# Patient Record
Sex: Female | Born: 2005
Health system: Southern US, Community
[De-identification: ages and names within clinical notes are randomized; demographics above are authoritative.]

## PROBLEM LIST (undated history)

## (undated) DIAGNOSIS — R51 Headache: Secondary | ICD-10-CM

## (undated) DIAGNOSIS — G360 Neuromyelitis optica [Devic]: Secondary | ICD-10-CM

## (undated) DIAGNOSIS — G379 Demyelinating disease of central nervous system, unspecified: Secondary | ICD-10-CM

## (undated) DIAGNOSIS — Z820 Family history of epilepsy and other diseases of the nervous system: Secondary | ICD-10-CM

## (undated) DIAGNOSIS — F419 Anxiety disorder, unspecified: Secondary | ICD-10-CM

## (undated) DIAGNOSIS — R011 Cardiac murmur, unspecified: Secondary | ICD-10-CM

## (undated) DIAGNOSIS — T7840XA Allergy, unspecified, initial encounter: Secondary | ICD-10-CM

## (undated) DIAGNOSIS — R519 Headache, unspecified: Secondary | ICD-10-CM

## (undated) HISTORY — DX: Headache: R51

## (undated) HISTORY — DX: Neuromyelitis optica (devic): G36.0

## (undated) HISTORY — DX: Cardiac murmur, unspecified: R01.1

## (undated) HISTORY — DX: Demyelinating disease of central nervous system, unspecified: G37.9

## (undated) HISTORY — DX: Family history of epilepsy and other diseases of the nervous system: Z82.0

## (undated) HISTORY — DX: Headache, unspecified: R51.9

## (undated) HISTORY — DX: Allergy, unspecified, initial encounter: T78.40XA

## (undated) HISTORY — DX: Anxiety disorder, unspecified: F41.9

---
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
## 2023-01-13 ENCOUNTER — Encounter: Payer: Self-pay | Admitting: Family Medicine

## 2023-01-13 ENCOUNTER — Ambulatory Visit: Payer: Federal, State, Local not specified - PPO | Admitting: Family Medicine

## 2023-01-13 VITALS — BP 104/80 | HR 98 | Temp 98.7°F | Ht 63.19 in | Wt 156.4 lb

## 2023-01-13 DIAGNOSIS — G43009 Migraine without aura, not intractable, without status migrainosus: Secondary | ICD-10-CM | POA: Diagnosis not present

## 2023-01-13 MED ORDER — SUMATRIPTAN SUCCINATE 50 MG PO TABS: 50.0000 mg | ORAL_TABLET | ORAL | 1 refills | Status: DC

## 2023-01-13 NOTE — Progress Notes (Unsigned)
New Patient Office Visit  Subjective:  Patient ID: Sharon Clark, female    DOB: [birth year]  Age: 17 y.o. MRN: [id number]  CC:  Chief Complaint  Patient presents with   Establish Care    Need new pcp     HPI Sharon Clark presents for new pt.  HA  HA since 7th grade.  Weren't as bad.  Around 9th, more frequent.  Would be qod.  Would be severe and need to sleep.  Has tried OTC and sinus meds.  Warm pack on head and sleep helps. Saw neuro in past 2 yrs.  Daily med not help and one for break thru. Getting HA every 1-2 wks. Around nose and eyes. Can be pounding and go to back of head. HA can last 6 hrs.  Some can be dull and lingering.  If starts in eve, bed helps. Can be nauseated.  Some are menstrual.  +photo, some phono.   No aura  HA center on [place] within 2 yrs. Has seen chiro-Dr. Jannifer Clark.  Told "reverse curve".  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 No pertinent surgical history.  History reviewed. No pertinent family history.  Social History   Socioeconomic History   Marital status: Single    Spouse name: Not on file   Number of children: Not on file   Years of education: Not on file   Highest education level: Not on file  Occupational History   Not on file  Tobacco Use   Smoking status: Never   Smokeless tobacco: Never  Vaping Use   Vaping Use: Never used  Substance and Sexual Activity   Alcohol use: Never   Drug use: Never   Sexual activity: Yes    Birth control/protection: Condom  Other Topics Concern   Not on file  Social History Narrative   Sharon Clark is a 9th grade student.   She attends [employer].   She lives with both parents. She has one sister.   She enjoys sleeping, going out sometimes, and school.   Social Determinants of Health    Financial Resource Strain: Not on file  Food Insecurity: Not on file  Transportation Needs: Not on file  Physical Activity: Not on file  Stress: Not on file  Social Connections: Not on file  Intimate Partner Violence: Not on file    ROS  ROS: Gen: no fever, chills  Skin: no rash, itching ENT: no ear pain, ear drainage, nasal congestion, rhinorrhea, sinus pressure, sore throat Eyes: no blurry vision, double vision Resp: no cough, wheeze,SOB CV: no CP, palpitations, LE edema,  GI: no heartburn, n/v/d/c, abd pain GU: no dysuria, urgency, frequency, hematuria.  Menses reg.  Occ skips a month.  condoms MSK: no joint pain, myalgias, back pain Neuro: no dizziness, , weakness, vertigo Psych: no depression, anxiety, insomnia, SI  Occ fatigue  Objective:   Today's Vitals: BP 104/80   Pulse 98   Temp 98.7 F (37.1 C) (Temporal)   Ht 5' 3.19" (1.605 m)   Wt 156 lb 6 oz (70.9 kg)   LMP 01/05/2023 (Exact Date)   SpO2 98%   BMI 27.54 kg/m   Physical Exam  Gen: WDWN NAD HEENT: NCAT, conjunctiva not injected,  sclera nonicteric TM WNL B, OP moist, no exudates  NECK:  supple, no thyromegaly, no nodes, no carotid bruits CARDIAC: RRR, S1S2+, no murmur. DP 2+B LUNGS: CTAB. No wheezes ABDOMEN:  BS+, soft, NTND, No HSM, no masses EXT:  no edema MSK: no gross abnormalities.  NEURO: A&O x3.  CN II-XII intact.  PSYCH: normal mood. Good eye contact   Assessment & Plan:   Problem List Items Addressed This Visit   None   Outpatient Encounter Medications as of 01/13/2023  Medication Sig   B Complex Vitamins (B COMPLEX PO) Take 1 tablet by mouth daily.   Cholecalciferol (D3 PO) Take 1 capsule by mouth daily.   Multiple Vitamin (MULTI-VITAMIN) tablet Take 1 tablet by mouth daily.   [DISCONTINUED] MIGRELIEF 200-180-50 MG TABS Take 2 tablets daily.   No facility-administered encounter medications on file as of 01/13/2023.    Follow-up: No follow-ups on file.   Wellington Hampshire, MD

## 2023-01-13 NOTE — Patient Instructions (Signed)
Welcome to [facility]! It was a pleasure meeting you today.  As discussed, Please schedule a 2-3 month follow up visit today.  Magnesium 250-'400mg'$  daily.    PLEASE NOTE:  If you had any LAB tests please let us know if you have not heard back within a few days. You may see your results on MyChart before we have a chance to review them but we will give you a call once they are reviewed by [place]. If we ordered any REFERRALS today, please let us know if you have not heard from their office within the next week.  Let us know through MyChart if you are needing REFILLS, or have your pharmacy send [place] the request. You can also use MyChart to communicate with me or any office staff.  Please try these tips to maintain a healthy lifestyle:  Eat most of your calories during the day when you are active. Eliminate processed foods including packaged sweets (pies, cakes, cookies), reduce intake of potatoes, white bread, white pasta, and white rice. Look for whole grain options, oat flour or almond flour.  Each meal should contain half fruits/vegetables, one quarter protein, and one quarter carbs (no bigger than a computer mouse).  Cut down on sweet beverages. This includes juice, soda, and sweet tea. Also watch fruit intake, though this is a healthier sweet option, it still contains natural sugar! Limit to 3 servings daily.  Drink at least 1 glass of water with each meal and aim for at least 8 glasses per day  Exercise at least 150 minutes every week.

## 2023-03-17 ENCOUNTER — Ambulatory Visit: Payer: Federal, State, Local not specified - PPO | Admitting: Family Medicine

## 2023-03-17 ENCOUNTER — Encounter: Payer: Self-pay | Admitting: Family Medicine

## 2023-03-17 VITALS — BP 110/84 | HR 100 | Temp 98.0°F | Resp 18 | Ht 63.19 in | Wt 161.1 lb

## 2023-03-17 DIAGNOSIS — G43009 Migraine without aura, not intractable, without status migrainosus: Secondary | ICD-10-CM | POA: Diagnosis not present

## 2023-03-17 MED ORDER — SUMATRIPTAN SUCCINATE 50 MG PO TABS: 50.0000 mg | ORAL_TABLET | ORAL | 3 refills | Status: DC

## 2023-03-17 NOTE — Progress Notes (Signed)
   Subjective:     Patient ID: Sharon Clark, female    DOB: 12-Oct-2006, 17 y.o.   MRN: [id number]  Chief Complaint  Patient presents with   Migraine    2 month follow-up on migraine, Imitrex helps, has had a headache everyday since last Friday since moving back home, possibly stress related     HPI  Migraine no aura.  Imitrex helps.  Occasional has to take second dose. . Was getting every 1-3/wks Moved back home last week(s) and now daily headache(s). Log-reviewed-as above.  Magnesium helps sleep better, but not sure about headache(s) prevention.  Did try some medications in past from headache(s) clinic-didn't work well and not want medications.  Average 10 headache(s)/month.    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 No pertinent surgical history.   Current Outpatient Medications:    B Complex Vitamins (B COMPLEX PO), Take 1 tablet by mouth daily., Disp: , Rfl:    Cholecalciferol (D3 PO), Take 1 capsule by mouth daily., Disp: , Rfl:    Multiple Vitamin (MULTI-VITAMIN) tablet, Take 1 tablet by mouth daily., Disp: , Rfl:    SUMAtriptan (IMITREX) 50 MG tablet, Take 1 tablet (50 mg total) by mouth as directed. Take 1 tab at onset of headache, can reapeat once in 2 hrs if needed, Disp: 30 tablet, Rfl: 3  No Known Allergies ROS neg/noncontributory except as noted HPI/below      Objective:     BP 110/84   Pulse 100   Temp 98 F (36.7 C) (Temporal)   Resp 18   Ht 5' 3.19" (1.605 m)   Wt 161 lb 2 oz (73.1 kg)   LMP 03/06/2023 (Exact Date) Comment: 5/3-03/11/23  SpO2 99%   BMI 28.37 kg/m  Wt Readings from  Last 3 Encounters:  03/17/23 161 lb 2 oz (73.1 kg) (89 %, Z= 1.22)*  01/13/23 156 lb 6 oz (70.9 kg) (87 %, Z= 1.11)*  08/11/18 130 lb 3.2 oz (59.1 kg) (78 %, Z= 0.77)*   * Growth percentiles are based on CDC (Girls, 2-20 Years) data.    Physical Exam   Gen: WDWN NAD HEENT: NCAT, conjunctiva not injected, sclera nonicteric NECK:  supple, no thyromegaly, no nodes, no carotid bruits CARDIAC: RRR, S1S2+, no murmur.  LUNGS: CTAB. No wheeze EXT:  no edema MSK: no gross abnormalities.  NEURO: A&O x3.  CN II-XII intact.  PSYCH: normal mood. Good eye contact     Assessment & Plan:  Migraine without aura and without status migrainosus, not intractable Assessment & Plan: Chronic. Controlled w/Imitrex 50 mg but taking 9+/month.  Discussed preventative medications-patient declined  she will pursue acupuncture/chiro/yoga, etc-agree.     Other orders -     SUMAtriptan Succinate; Take 1 tablet (50 mg total) by mouth as directed. Take 1 tab at onset of headache, can reapeat once in 2 hrs if needed  Dispense: 30 tablet; Refill: 3    Angelena Sole, MD

## 2023-03-17 NOTE — Patient Instructions (Signed)
Ginger    

## 2023-03-17 NOTE — Assessment & Plan Note (Signed)
Chronic. Controlled w/Imitrex 50 mg but taking 9+/month.  Discussed preventative medications-patient declined  she will pursue acupuncture/chiro/yoga, etc-agree.

## 2023-03-20 ENCOUNTER — Other Ambulatory Visit: Payer: Self-pay

## 2023-03-20 ENCOUNTER — Emergency Department (HOSPITAL_COMMUNITY)
Admission: EM | Admit: 2023-03-20 | Discharge: 2023-03-21 | Disposition: A | Discharge status: Home / Self Care | Payer: Federal, State, Local not specified - PPO | Attending: Emergency Medicine | Admitting: Emergency Medicine

## 2023-03-20 ENCOUNTER — Encounter (HOSPITAL_COMMUNITY): Payer: Self-pay

## 2023-03-20 DIAGNOSIS — R110 Nausea: Secondary | ICD-10-CM | POA: Insufficient documentation

## 2023-03-20 DIAGNOSIS — R519 Headache, unspecified: Secondary | ICD-10-CM | POA: Diagnosis not present

## 2023-03-20 DIAGNOSIS — Z0389 Encounter for observation for other suspected diseases and conditions ruled out: Secondary | ICD-10-CM | POA: Diagnosis not present

## 2023-03-20 NOTE — ED Triage Notes (Signed)
Pt. Arrives c/o a headache since 7pm. Pt. Endorses nausea, and photo sensitivity. Pt. Has a family hx of brain aneurysms at young age and is worried that she could have one as well.

## 2023-03-21 ENCOUNTER — Emergency Department (HOSPITAL_COMMUNITY): Payer: Federal, State, Local not specified - PPO

## 2023-03-21 DIAGNOSIS — Z0389 Encounter for observation for other suspected diseases and conditions ruled out: Secondary | ICD-10-CM | POA: Diagnosis not present

## 2023-03-21 LAB — BASIC METABOLIC PANEL
Anion gap: 7 (ref 5–15)
Anion gap: 7 (ref 5–15)
BUN: 9 mg/dL (ref 6–20)
BUN: 11 mg/dL (ref 6–20)
CO2: 14 mmol/L — ABNORMAL LOW (ref 22–32)
CO2: 23 mmol/L (ref 22–32)
Calcium: 5.9 mg/dL — CL (ref 8.9–10.3)
Calcium: 8.9 mg/dL (ref 8.9–10.3)
Chloride: 117 mmol/L — ABNORMAL HIGH (ref 98–111)
Chloride: 103 mmol/L (ref 98–111)
Creatinine, Ser: 0.36 mg/dL — ABNORMAL LOW (ref 0.44–1.00)
Creatinine, Ser: 0.68 mg/dL (ref 0.44–1.00)
GFR, Estimated: >60 mL/min (ref >60)
GFR, Estimated: >60 mL/min (ref >60)
Glucose, Bld: 83 mg/dL (ref 70–99)
Glucose, Bld: 105 mg/dL — ABNORMAL HIGH (ref 70–99)
Potassium: 2.7 mmol/L — CL (ref 3.5–5.1)
Potassium: 3.7 mmol/L (ref 3.5–5.1)
Sodium: 138 mmol/L (ref 135–145)
Sodium: 133 mmol/L — ABNORMAL LOW (ref 135–145)

## 2023-03-21 LAB — CBC
HCT: 42 % (ref 36.0–46.0)
Hemoglobin: 13.4 g/dL (ref 12.0–15.0)
MCH: 28.8 pg (ref 26.0–34.0)
MCHC: 31.9 g/dL (ref 30.0–36.0)
MCV: 90.3 fL (ref 80.0–100.0)
Platelets: 254 10*3/uL (ref 150–400)
RBC: 4.65 MIL/uL (ref 3.87–5.11)
RDW: 12.6 % (ref 11.5–15.5)
WBC: 18.9 10*3/uL — ABNORMAL HIGH (ref 4.0–10.5)
nRBC: 0 % (ref 0.0–0.2)

## 2023-03-21 MED ORDER — SODIUM CHLORIDE (PF) 0.9 % IJ SOLN
INTRAMUSCULAR | Status: AC
  Filled 2023-03-21: qty 50

## 2023-03-21 MED ORDER — IOHEXOL 350 MG/ML SOLN
75.0000 mL | Freq: Once | INTRAVENOUS | Status: AC | PRN
  Administered 2023-03-21: 75 mL via INTRAVENOUS

## 2023-03-21 NOTE — Discharge Instructions (Signed)
You were evaluated in the Emergency Department and after careful evaluation, we did not find any emergent condition requiring admission or further testing in the hospital.  Your exam/testing today is overall reassuring.  Labs and CT scan reassuring.  Recommend follow-up with neurology to discuss your headaches.  Please return to the Emergency Department if you experience any worsening of your condition.   Thank you for allowing [place] to be a part of your care.

## 2023-03-21 NOTE — ED Provider Notes (Signed)
[facility]-EMERGENCY DEPT [facility] Emergency Department Provider Note MRN:  [id number]  Arrival date & time: 03/21/23     Chief Complaint   Headache   History of Present Illness   Sharon Clark is a 17 y.o. year-old female with a history of optic neuritis presenting to the ED with chief complaint of headache.  Mild headache starting at 7 PM similar to her prior headaches.  Became much worse while she was sleeping, woke up with more severe headache with sensitivity to bright lights and loud noises.  Nausea but no vomiting.  No numbness or weakness to the arms or legs, no recent fever.  Since coming to the emergency department the headache has improved, feeling much better.  Family history of brain aneurysms and AVMs.  Patient has a personal history of demyelinating lesions when she was younger.  Review of Systems  A thorough review of systems was obtained and all systems are negative except as noted in the HPI and PMH.   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 No pertinent surgical history.  Family History  Problem Relation Age of Onset   Diabetes Mother    Hypertension Father    Hyperlipidemia Father    Cerebral aneurysm Cousin     Social History   Socioeconomic History   Marital status: Single    Spouse name: Not on file   Number of children: 0   Years of education: Not on file   Highest education level: Not on file  Occupational History   Not on file  Tobacco Use   Smoking status: Never   Smokeless tobacco: Never  Vaping Use   Vaping Use: Never used  Substance and Sexual Activity   Alcohol use: Never   Drug use: Never   Sexual activity: Yes    Birth control/protection: Condom  Other Topics Concern   Not on file  Social History Narrative   She  lives with both parents. She has one sister.   Lives in [place] in [place]computer [employer] and environmental [place]   Social Determinants of Health   Financial Resource Strain: Not on file  Food Insecurity: Not on file  Transportation Needs: Not on file  Physical Activity: Not on file  Stress: Not on file  Social Connections: Not on file  Intimate Partner Violence: Not on file     Physical Exam   Vitals:   03/21/23 0415 03/21/23 0416  BP: 114/78   Pulse: 92   Resp: 18 18  Temp:    SpO2: 100%     CONSTITUTIONAL: Well-appearing, NAD NEURO/PSYCH:  Alert and oriented x 3, no focal deficits EYES:  eyes equal and reactive ENT/NECK:  no LAD, no JVD CARDIO: Regular rate, well-perfused, normal S1 and S2 PULM:  CTAB no wheezing or rhonchi GI/GU:  non-distended, non-tender MSK/SPINE:  No gross deformities, no edema SKIN:  no rash, atraumatic   *Additional and/or pertinent findings included in MDM below  Diagnostic and Interventional Summary    EKG Interpretation  Date/Time:    Ventricular Rate:    PR Interval:    QRS Duration:   QT Interval:    QTC Calculation:   R Axis:     Text Interpretation:  Labs Reviewed  CBC - Abnormal; Notable for the following components:      Result Value   WBC 18.9 (*)    All other components within normal limits  BASIC METABOLIC PANEL - Abnormal; Notable for the following components:   Potassium 2.7 (*)    Chloride 117 (*)    CO2 14 (*)    Creatinine, Ser 0.36 (*)    Calcium 5.9 (*)    All other components within normal limits  BASIC METABOLIC PANEL - Abnormal; Notable for the following components:   Sodium 133 (*)    Glucose, Bld 105 (*)    All other components within normal limits    CT ANGIO HEAD NECK W WO CM  Final Result      Medications  iohexol (OMNIPAQUE) 350 MG/ML injection 75 mL (75 mLs Intravenous Contrast Given 03/21/23 0245)     Procedures  /  Critical Care Procedures  ED Course and Medical  Decision Making  Initial Impression and Ddx Given the severity of the headache and family history and patient's personal history of demyelinating lesions will obtain CTA head and neck to exclude life-threatening bleeding.  Overall reassuring neurological exam and vital signs at this time.  Past medical/surgical history that increases complexity of ED encounter: Migraines  Interpretation of Diagnostics I personally reviewed the laboratory assessment and my interpretation is as follows: Initial concern for renal tubular acidosis given the low potassium high chloride, low calcium, acidosis.  However repeat BMP is normal.  Spoke with nursing, likely that the initial blood sample was contaminated with normal saline.  CTA is normal  Patient Reassessment and Ultimate Disposition/Management     Patient continues to feel well, appropriate for discharge.  Patient management required discussion with the following services or consulting groups:  None  Complexity of Problems Addressed Acute illness or injury that poses threat of life of bodily function  Additional Data Reviewed and Analyzed Further history obtained from: Further history from spouse/family member  Additional Factors Impacting ED Encounter Risk None  Elmer Sow. Pilar Plate, MD [facility] Emergency Medicine [facility] [email or web address]  Final Clinical Impressions(s) / ED Diagnoses     ICD-10-CM   1. Nonintractable headache, unspecified chronicity pattern, unspecified headache type  R51.9       ED Discharge Orders          Ordered    Ambulatory referral to Neurology       Comments: An appointment is requested in approximately: 2 weeks   03/21/23 0514             Discharge Instructions Discussed with and Provided to Patient:     Discharge Instructions      You were evaluated in the Emergency Department and after careful evaluation, we did not find any emergent condition requiring admission or  further testing in the hospital.  Your exam/testing today is overall reassuring.  Labs and CT scan reassuring.  Recommend follow-up with neurology to discuss your headaches.  Please return to the Emergency Department if you experience any worsening of your condition.   Thank you for allowing [place] to be a part of your care.       Sabas Sous, MD 03/21/23 [phone]

## 2023-03-23 DIAGNOSIS — M9901 Segmental and somatic dysfunction of cervical region: Secondary | ICD-10-CM | POA: Diagnosis not present

## 2023-03-23 DIAGNOSIS — M40292 Other kyphosis, cervical region: Secondary | ICD-10-CM | POA: Diagnosis not present

## 2023-03-23 DIAGNOSIS — G44229 Chronic tension-type headache, not intractable: Secondary | ICD-10-CM | POA: Diagnosis not present

## 2023-03-23 DIAGNOSIS — M62830 Muscle spasm of back: Secondary | ICD-10-CM | POA: Diagnosis not present

## 2023-03-25 DIAGNOSIS — M9901 Segmental and somatic dysfunction of cervical region: Secondary | ICD-10-CM | POA: Diagnosis not present

## 2023-03-25 DIAGNOSIS — G44229 Chronic tension-type headache, not intractable: Secondary | ICD-10-CM | POA: Diagnosis not present

## 2023-03-25 DIAGNOSIS — M62830 Muscle spasm of back: Secondary | ICD-10-CM | POA: Diagnosis not present

## 2023-03-25 DIAGNOSIS — M40292 Other kyphosis, cervical region: Secondary | ICD-10-CM | POA: Diagnosis not present

## 2023-03-26 ENCOUNTER — Telehealth: Payer: Self-pay

## 2023-03-26 ENCOUNTER — Telehealth: Payer: Self-pay | Admitting: Neurology

## 2023-03-26 ENCOUNTER — Ambulatory Visit: Payer: Federal, State, Local not specified - PPO | Admitting: Neurology

## 2023-03-26 ENCOUNTER — Encounter: Payer: Self-pay | Admitting: Neurology

## 2023-03-26 NOTE — Telephone Encounter (Signed)
Patient no showed for an appointment for headache evaluation today. I recommend rescheduling with a provider who sees MS as patient has a history of optic neuritis and demyelinating disease.  Has seen pediatric neuro and [facility] neuro.

## 2023-03-26 NOTE — Telephone Encounter (Signed)
PA initiated via Covermymeds;KEY: [id number]. Awaiting determination.

## 2023-03-27 ENCOUNTER — Other Ambulatory Visit: Payer: Self-pay | Admitting: Family Medicine

## 2023-03-27 MED ORDER — SUMATRIPTAN SUCCINATE 50 MG PO TABS: 50.0000 mg | ORAL_TABLET | ORAL | 3 refills | Status: DC

## 2023-03-27 NOTE — Telephone Encounter (Signed)
PA denied.   The use of this medication without also currently taking migraine prophylactic therapy or having an inadequate treatment response, intolerance, or contraindication to migraine prophylactic therapy does not establish medical necessity for this drug. Medical necessity is determined by adherence to generally accepted standards of medical practice in the [place], is clinically appropriate, in terms of type, frequency, extent, site, duration and considered effective for the patient's illness, injury, disease, or its symptoms. For more information, please refer to the [employer] brochure (RI 71-005 or RI 71-017). Details regarding medical necessity are listed in section 10.

## 2023-03-31 DIAGNOSIS — M62830 Muscle spasm of back: Secondary | ICD-10-CM | POA: Diagnosis not present

## 2023-03-31 DIAGNOSIS — M40292 Other kyphosis, cervical region: Secondary | ICD-10-CM | POA: Diagnosis not present

## 2023-03-31 DIAGNOSIS — M9901 Segmental and somatic dysfunction of cervical region: Secondary | ICD-10-CM | POA: Diagnosis not present

## 2023-03-31 DIAGNOSIS — G44229 Chronic tension-type headache, not intractable: Secondary | ICD-10-CM | POA: Diagnosis not present

## 2023-04-01 DIAGNOSIS — M9901 Segmental and somatic dysfunction of cervical region: Secondary | ICD-10-CM | POA: Diagnosis not present

## 2023-04-01 DIAGNOSIS — M40292 Other kyphosis, cervical region: Secondary | ICD-10-CM | POA: Diagnosis not present

## 2023-04-01 DIAGNOSIS — M62830 Muscle spasm of back: Secondary | ICD-10-CM | POA: Diagnosis not present

## 2023-04-01 DIAGNOSIS — G44229 Chronic tension-type headache, not intractable: Secondary | ICD-10-CM | POA: Diagnosis not present

## 2023-04-06 DIAGNOSIS — M62830 Muscle spasm of back: Secondary | ICD-10-CM | POA: Diagnosis not present

## 2023-04-06 DIAGNOSIS — M9901 Segmental and somatic dysfunction of cervical region: Secondary | ICD-10-CM | POA: Diagnosis not present

## 2023-04-06 DIAGNOSIS — M40292 Other kyphosis, cervical region: Secondary | ICD-10-CM | POA: Diagnosis not present

## 2023-04-06 DIAGNOSIS — G44229 Chronic tension-type headache, not intractable: Secondary | ICD-10-CM | POA: Diagnosis not present

## 2023-04-08 DIAGNOSIS — G44229 Chronic tension-type headache, not intractable: Secondary | ICD-10-CM | POA: Diagnosis not present

## 2023-04-08 DIAGNOSIS — M9901 Segmental and somatic dysfunction of cervical region: Secondary | ICD-10-CM | POA: Diagnosis not present

## 2023-04-08 DIAGNOSIS — M40292 Other kyphosis, cervical region: Secondary | ICD-10-CM | POA: Diagnosis not present

## 2023-04-08 DIAGNOSIS — M62830 Muscle spasm of back: Secondary | ICD-10-CM | POA: Diagnosis not present

## 2023-04-15 DIAGNOSIS — M9901 Segmental and somatic dysfunction of cervical region: Secondary | ICD-10-CM | POA: Diagnosis not present

## 2023-04-15 DIAGNOSIS — G44229 Chronic tension-type headache, not intractable: Secondary | ICD-10-CM | POA: Diagnosis not present

## 2023-04-15 DIAGNOSIS — M40292 Other kyphosis, cervical region: Secondary | ICD-10-CM | POA: Diagnosis not present

## 2023-04-15 DIAGNOSIS — M62830 Muscle spasm of back: Secondary | ICD-10-CM | POA: Diagnosis not present

## 2023-04-20 DIAGNOSIS — M9901 Segmental and somatic dysfunction of cervical region: Secondary | ICD-10-CM | POA: Diagnosis not present

## 2023-04-20 DIAGNOSIS — M62830 Muscle spasm of back: Secondary | ICD-10-CM | POA: Diagnosis not present

## 2023-04-20 DIAGNOSIS — G44229 Chronic tension-type headache, not intractable: Secondary | ICD-10-CM | POA: Diagnosis not present

## 2023-04-20 DIAGNOSIS — M40292 Other kyphosis, cervical region: Secondary | ICD-10-CM | POA: Diagnosis not present

## 2023-04-23 ENCOUNTER — Ambulatory Visit: Payer: Federal, State, Local not specified - PPO | Admitting: Family Medicine

## 2023-04-23 ENCOUNTER — Encounter: Payer: Self-pay | Admitting: Neurology

## 2023-04-23 ENCOUNTER — Encounter: Payer: Self-pay | Admitting: Family Medicine

## 2023-04-23 VITALS — BP 116/78 | HR 85 | Temp 98.2°F | Resp 16 | Ht 63.19 in | Wt 163.0 lb

## 2023-04-23 DIAGNOSIS — G44219 Episodic tension-type headache, not intractable: Secondary | ICD-10-CM | POA: Diagnosis not present

## 2023-04-23 DIAGNOSIS — G43009 Migraine without aura, not intractable, without status migrainosus: Secondary | ICD-10-CM

## 2023-04-23 MED ORDER — CEFALY KIT DEVI: 1.0000 | Freq: Every day | 1 refills | Status: DC

## 2023-04-23 NOTE — Progress Notes (Signed)
Subjective:     Patient ID: Sharon Clark, female    DOB: Mar 06, 2006, 17 y.o.   MRN: [id number]  Chief Complaint  Patient presents with   Migraine    Follow-up on migraines, getting worse, have a referral to [facility], but they can't see her until August, would like referral to somewhere else    HPI  Migraine-getting worse and more frequent - at least daily.  Sometimes just pressure and resolves.  Taking imitrex 3x/wk past 3 wks.  Has referral to [facility] but not avail till August.  Seeing chiro.    Wants to avoid meds if possible. 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 No pertinent surgical history.   Current Outpatient Medications:    B Complex Vitamins (B COMPLEX PO), Take 1 tablet by mouth daily., Disp: , Rfl:    Cholecalciferol (D3 PO), Take 1 capsule by mouth daily., Disp: , Rfl:    MAGNESIUM PO, Take by mouth daily., Disp: , Rfl:    Multiple Vitamin (MULTI-VITAMIN) tablet, Take 1 tablet by mouth daily., Disp: , Rfl:    Nerve Stimulator (CEFALY KIT) DEVI, 1 each by Does not apply route daily at 12 noon., Disp: 1 each, Rfl: 1   SUMAtriptan (IMITREX) 50 MG tablet, Take 1 tablet (50 mg total) by mouth as directed. Take 1 tab at onset of headache, can reapeat once in 2 hrs if needed, Disp: 10 tablet, Rfl: 3  No Known Allergies ROS neg/noncontributory except as noted HPI/below      Objective:     BP 116/78   Pulse 85   Temp 98.2 F (36.8 C) (Temporal)   Resp 16   Ht 5' 3.19" (1.605 m)   Wt 163 lb (73.9 kg)   SpO2 98%   BMI 28.70 kg/m  Wt Readings from Last 3 Encounters:  04/23/23 163 lb (73.9 kg) (90 %, Z=  1.26)*  03/20/23 160 lb (72.6 kg) (88 %, Z= 1.19)*  03/17/23 161 lb 2 oz (73.1 kg) (89 %, Z= 1.22)*   * Growth percentiles are based on CDC (Girls, 2-20 Years) data.    Physical Exam   Gen: WDWN NAD HEENT: NCAT, conjunctiva not injected, sclera nonicteric CARDIAC: RRR, S1S2+, no murmur. EXT:  no edema MSK: no gross abnormalities.  NEURO: A&O x3.  CN II-XII intact.  PSYCH: normal mood. Good eye contact  Reviewed ER records-CTA from 03/21/23     Assessment & Plan:  Migraine without aura and without status migrainosus, not intractable -     Ambulatory referral to Neurology -     Cefaly Kit; 1 each by Does not apply route daily at 12 noon.  Dispense: 1 each; Refill: 1  Episodic tension-type headache, not intractable -     Ambulatory referral to Neurology  Chronic migraine-worse since back at home.  Doesn't want to take meds.  Will continue imitrex 50mg , mg, B complex.  Can add riboflavin.  Acupuncture.  Discussed Cefaly(not sure insurance will cover).  Advised to call other neuro offices to  see if sooner appt.    Return if symptoms worsen or fail to improve.  Angelena Sole, MD

## 2023-04-23 NOTE — Patient Instructions (Addendum)
Riboflavin  Acupuncture  Sent Cefaly to pharmacy  Call neurologists to see if sooner appoint available and if need referral, let me know to whom.

## 2023-05-28 IMAGING — CR DG CERVICAL SPINE COMPLETE 4+V
5 series · 5 of 5 positions shown · non-contrast
Comparison: None.

CLINICAL DATA: Cervicalgia.  Migraines.

EXAM:
CERVICAL SPINE - COMPLETE 4+ VIEW

[w c-spine lat]
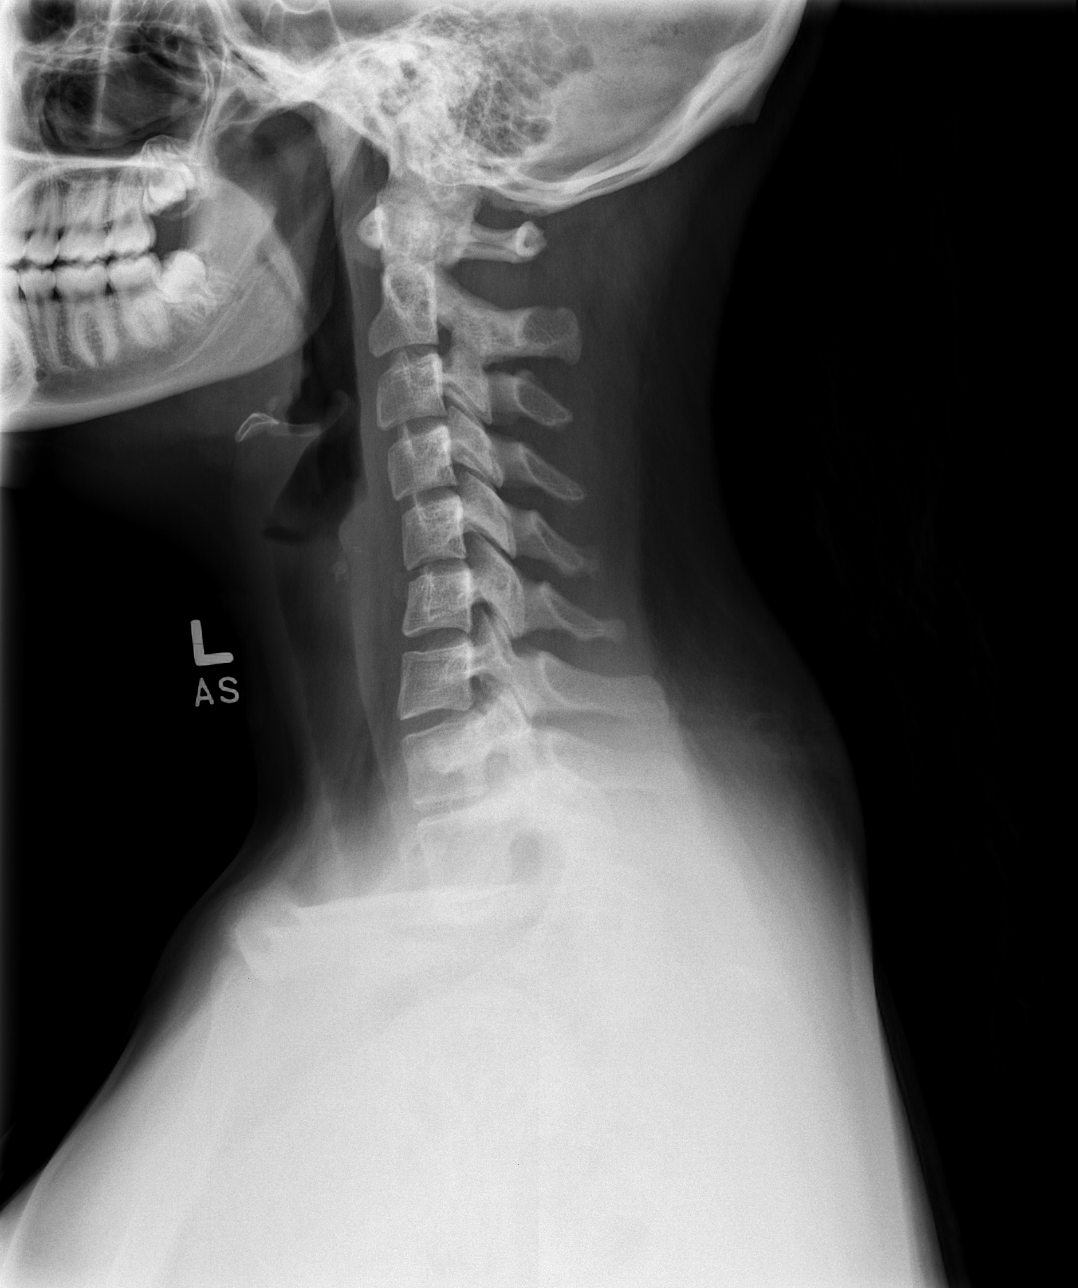

[w c-spine oblique (1 of 2)]
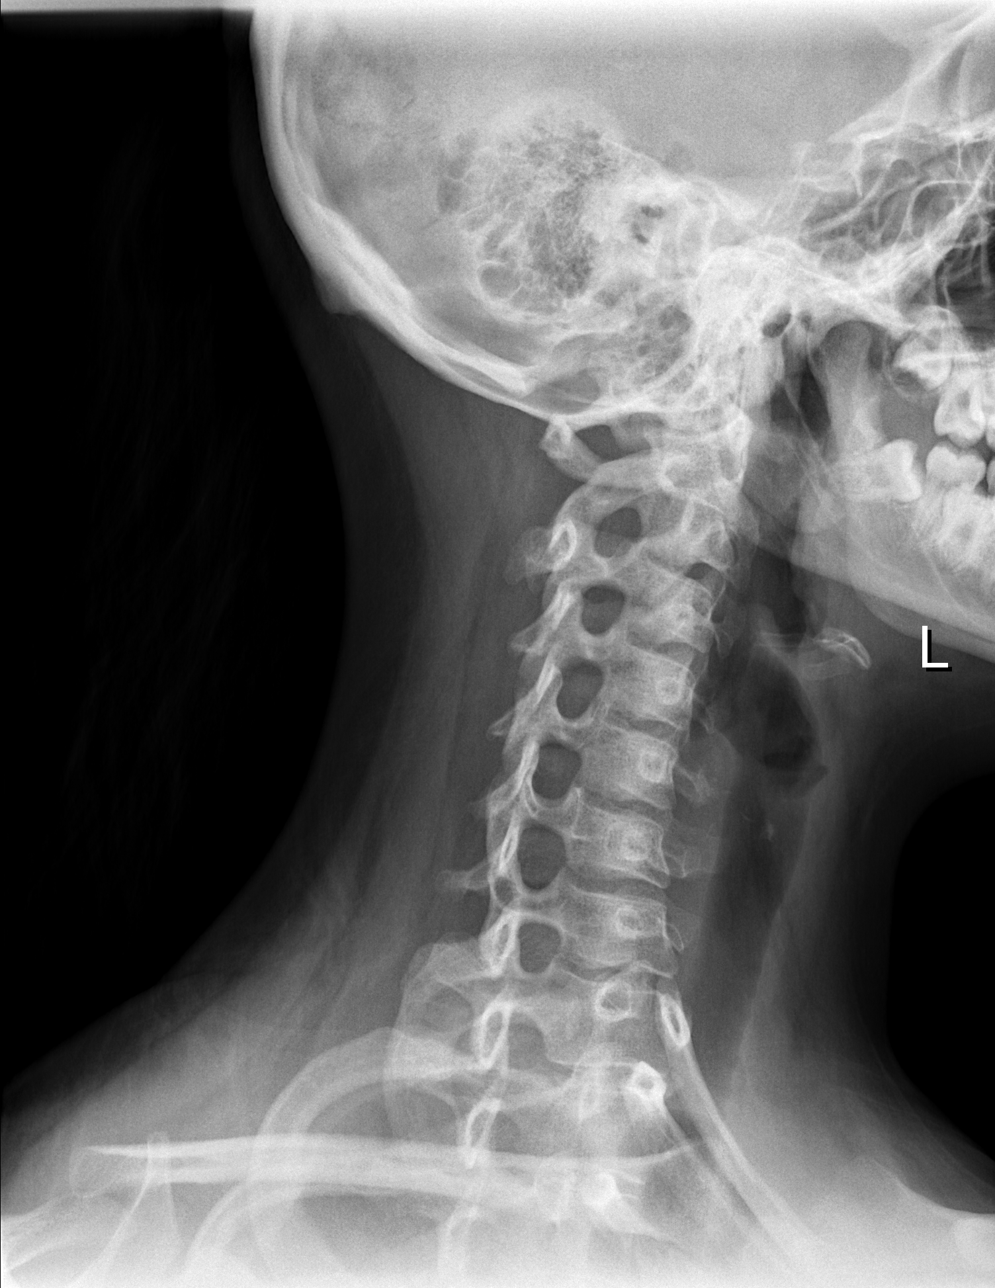

[w c-spine oblique (2 of 2)]
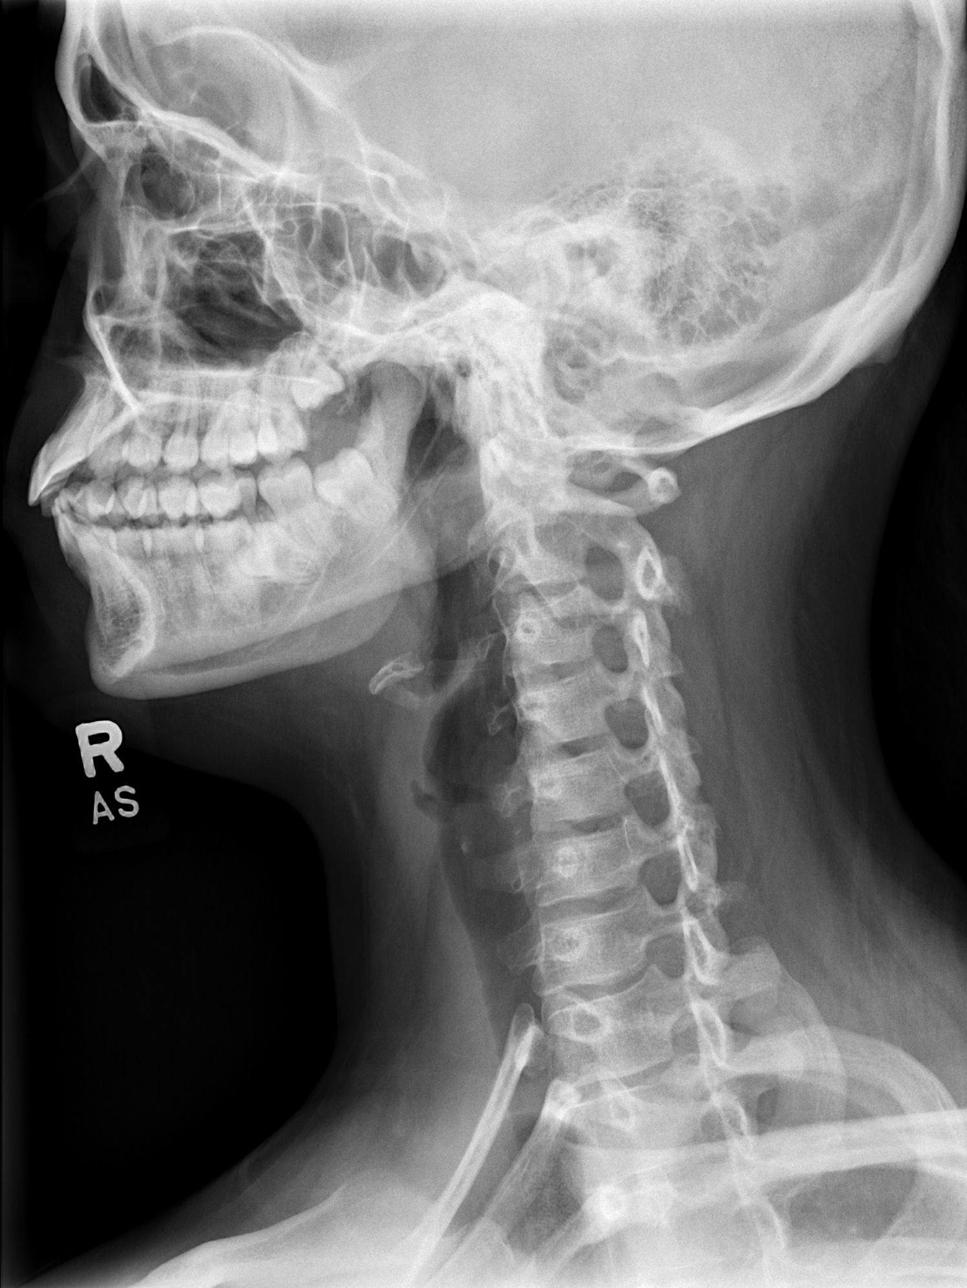

[w c-spine a.p. *]
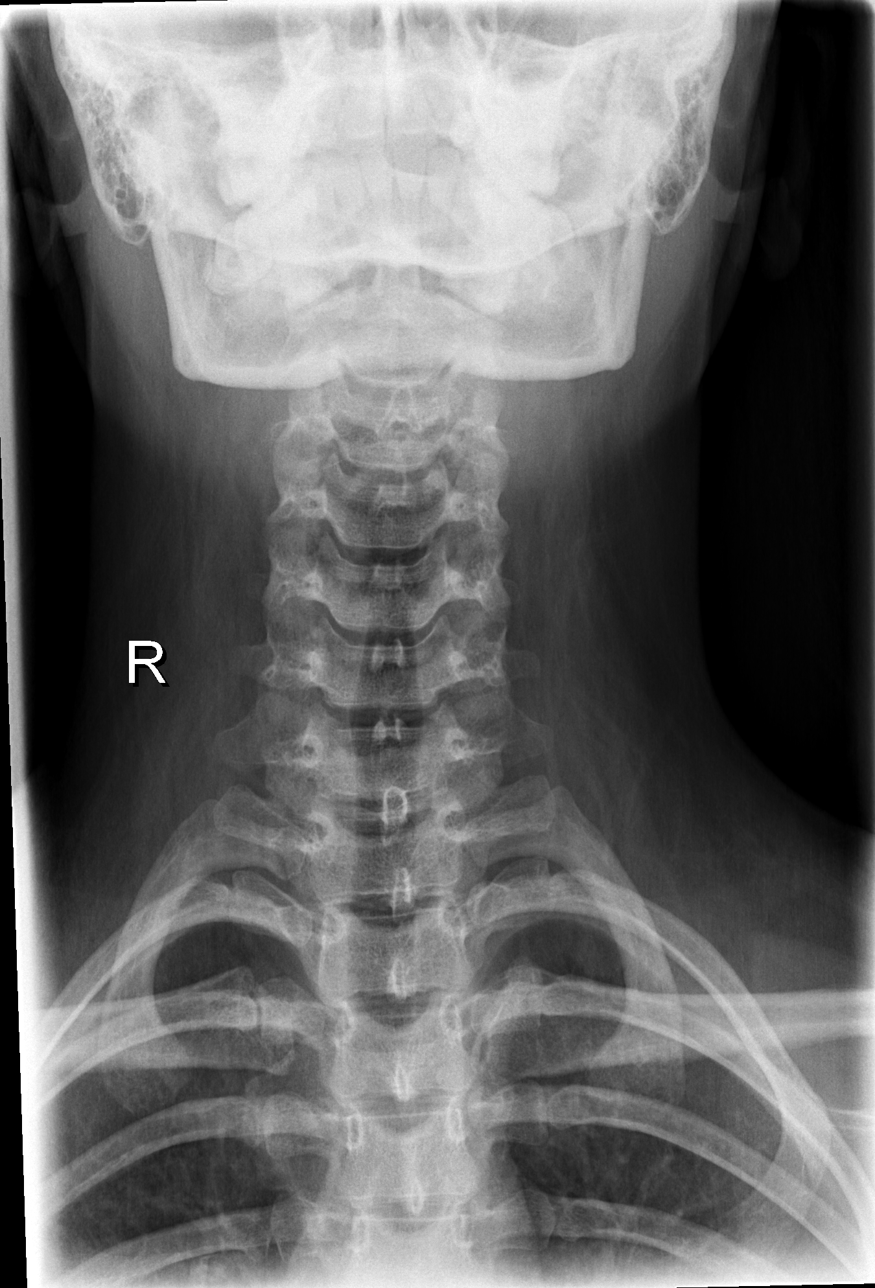

[w c-spine odontoid *]
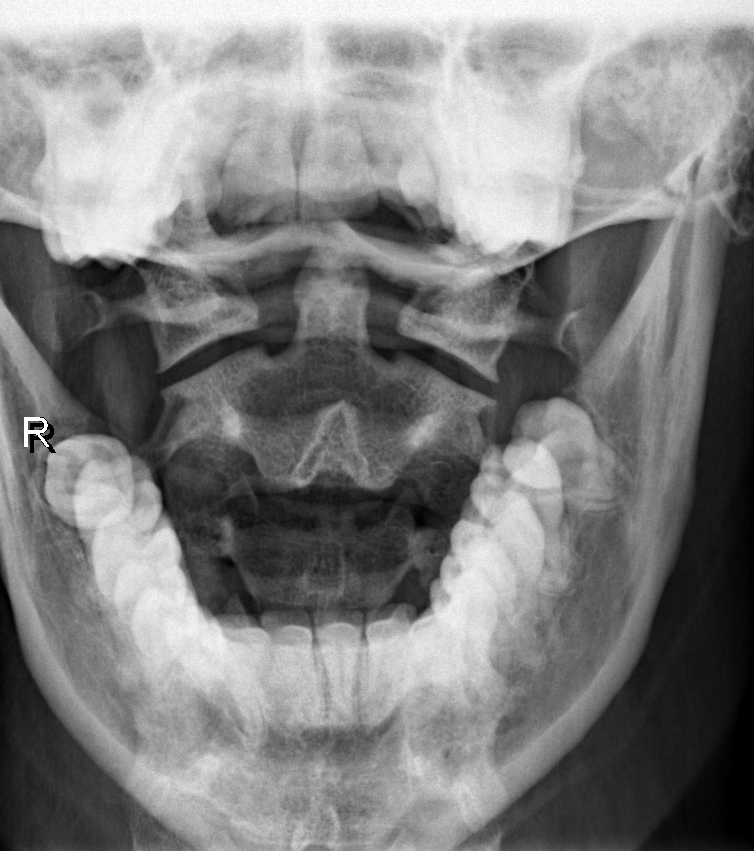

[5 of 5 positions shown; findings below may reference images not displayed]

FINDINGS: There is no evidence of cervical spine fracture or prevertebral soft
tissue swelling. There is mild reversal of the normal cervical spine
lordosis. No other significant bone abnormalities are identified.
IMPRESSION: Negative cervical spine radiographs.

## 2023-05-28 NOTE — Progress Notes (Signed)
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  If headaches worsen (such as when she returns to school), will start propranolol ER 60mg  daily Migraine rescue:  Stop sumatriptan.  Will try rizatriptan 10mg  .  If ineffective, then next time she can try sumatriptan 100mg .  Zofran 4mg  for nausea.   Limit use of pain relievers to no more than 2 days out of week to prevent risk of rebound or medication-overuse headache. Keep headache diary Follow up 6 months.   Subjective:  Sharon Clark is a 17 year old right-handed female with anxiety, bicuspid aortic valve, and history of right optic neuritis/clinically isolated syndrome who presents for headaches.   History supplemented by pediatric neurology and referring provider's note.  MRI of brain and orbitrs from 10/21/2010 and CTA head and neck personally reviewed.  Other MRIs not available for review.      She has past history of right optic neuritis in 2011.  Brain MRI revealed multiple lesions.  Admitted to [facility] where she underwent workup.  MRI of cervical/thoracic/lumbar spine was negative for cord lesions or enhancing abnormalities.  CSF results were normal, including cell count, protein, glucose, ACE, ANA, negative HSV, VZV, EBV, CMP, cryptococcal Ags, Toxoplasma Abs, RPR, and negative oligoclonal bands.  Serum anti-NMO Ab was negative.  She was treated with IV SoluMedrol for 3 days followed by prednisone taper.  For 3 years, she has been monitored with follow-up MRIs every 6 months which revealed resolution of the lesions  over time.  She was followed by [facility] neurology.  Unclear if she has MS vs sequela to flu mist.  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    Migraines since [age] years old.  Initially infrequent but progressively got worse during highschool.  They became almost daily.  Occur with standing and routine movement.  They improved when she went away to college, occurring once every 2 weeks.  However, frequency started to increase at end of the semester.  When she returned home for winter break, headaches improved.  Became worse mid second semester, almost daily again, 9/10 severity.  She needed to be treated in the ED.  She went back to her chiropractor and they are now less often.  They now are 5/10 pounding and pressure unilateral (either side) between and above eyes and sometimes occipital region.  Associated with nausea, photophobia, phonophobia, osmophobia, sometimes vomiting but no visual symptoms, fatigue but no numbness or weakness.  No preceding aura.  Lasts at least 6 hours to most of day (originally within one hour with sumatriptan but has lost efficacy).  Since seeing chiropractor, they occur once every 3 weeks.  Out of school now for the summer.  Triggers are unknown but maybe environmental as they initially improved after going to school.  Stress may be a trigger.     Followed by pediatric neurology at [facility] for a few years.  At one point, she exhibited right sided weakness in 2015 and had MRI of brain performed which did not reveal any new or enhancing lesions.  Does not remember this event.  No recurrence.  Past NSAIDS/analgesics:  ibuprofen, acetaminophen Past abortive triptans:  none Past abortive ergotamine:  none Past muscle relaxants:  none Past anti-emetic:  Compazine Past antihypertensive medications:  none Past antidepressant medications:  amitriptyline Past anticonvulsant medications:  none Past anti-CGRP:  none Past  vitamins/Herbal/Supplements:  none Past antihistamines/decongestants:  none Other past therapies:  none  Current NSAIDS/analgesics:  none Current triptans:  sumatriptan 50mg  Current ergotamine:  none Current anti-emetic:  none Current muscle relaxants:  none Current Antihypertensive medications:  none Current Antidepressant medications:  none Current Anticonvulsant medications:  none Current anti-CGRP:  none Current Vitamins/Herbal/Supplements:  magnesium 250mg  daily, MVI, D3, B complex Current Antihistamines/Decongestants:  none Other therapy:  warm pack, chiropractic therapy Birth control:  none   Caffeine:  No coffee.  No soda or tea Diet:  40-60 + oz water daily.  Tries not to skip meals Exercise:  cardio/running, weights Depression:  not currently; Anxiety:  sometimes Other pain:  none Sleep hygiene:  Often fatigued.  Sleeps 10 hours a night. Family history::  father (migraines), paternal grandmother (migraines), maternal cousins (migraines).  Maternal cousin and one other cousin had cerebral aneurysm.    10/21/2010 MRI BRAIN:  1.  Multiple diffuse peripherally enhancing lesions throughout the brain as described.  This most likely represents a diffuse  demyelinating process or infectious/inflammatory process.  Tumefactive multiple sclerosis is the first consideration.  There is a history of recent sinus infection.  This could represent an aggressive ADEM.  If the patient is immune compromised, this could represents an infectious process, most likely viral or fungal. Tuberculosis could be considered.  The typical basilar meningitis is not present.  10/21/2010 MRI ORBITS W WO:  Findings compatible with right optic neuritis.  This favors a demyelinating process for the multiple intracranial lesions.  10/14/2010 MRI TOTAL CTL SPINE W WO:  Normal appearance of the cervical, thoracic and lumbar spine. 12/02/2010 MRI BRAIN W WO:  Marked interval improvement in the appearance of the brain  with a significant interval decrease in the size of multifocal white matter lesions involving the supratentorial and infratentorial brain parenchyma, as described above. These lesion do not restrict  diffusion and no longer enhance.  Interval resolution of mass effect  on the surrounding brain parenchyma. Differential diagnosis remains  unchanged with imaging appearance favoring demyelinating diseases,  including both ADEM and multiple sclerosis. Neoplasm is considered  less likely given the involvement of the right optic nerve which is best seen on the outside hospital dedicated orbit MRI on 10/21/2010. No definite new lesions identified. Improvement of lesions is likely related to an interval response to steroid therapy. Persistent abnormal hyperintense T2/FLAIR signal in the right optic nerve with possible subtle persistent enhancement in the orbital apex. There is some questionable signal abnormality in the  intraorbital left optic nerve however, evaluation is limited as dedicated views of the orbit were performed on this examination. Recommend attention on follow up.  12/02/2010 VISUAL EVOKED RESPONSE:  This was an abnormal VEP recording.  This recording showed significant prolongation of the P100 latency of the right eye, which is consistent with possibility of demyelinating lesion anterior to optic chiasm.  Clinical correlation is recommended.  02/23/2011 MRI BRAIN W WO:  Multiple persistent but much improved overall appearance of size in conspicuity of several white matter lesions many with a masslike configuration of the cerebral hemispheres and cerebellar hemispheres bilaterally. Persistent abnormal signal in the right optic nerve is likely present. No evidence of enhancement. The appearance continues to be consistent  with suspected demyelination such as ADEM versus multiple sclerosis versus unusual manifestation of CNS infection  09/15/2011 MRI BRAIN WO:  1. Persistent, but decreased conspicuity  of bilateral cerebral and cerebellar white matter lesions is suggestive of positive therapeutic response/continued improvement over serial examinations. No definite new lesions or restricted diffusion.  As described previously, appearance remains consistent with a demyelinating process.  2. Stable appearance of abnormal FLAIR hyperintensity involving the right optic nerve.  03/15/2012 MRI BRAIN WO:  Similar in number and distribution of T2/FLAIR hyperintense white matter lesions bilaterally, with continued decreased conspicuity and no definite new lesions or restricted diffusion. Overall appearance remains compatible with positive therapeutic  response/continued resolution.  03/14/2013 MRI BRAIN WO:  Stable appearance of bilateral white matter lesions (likely demyelinating disaease) compared to 03/15/2012.  02/11/2014 MRI BRAIN W WO:  Apparently increased T2/FLAIR signal intensity within the previously identified white matter lesions is secondary to differences in field strength. No new, enhancing, or diffusion-restricting lesions are identified. No acute intracranial abnormality.   03/20/2013 CTA HEAD & NECK:  1. Normal CTA of the head and neck. No large vessel occlusion,hemodynamically significant stenosis, or other acute vascularabnormality. No aneurysm.2. No other acute intracranial abnormality.     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  MEDICATIONS: Current Outpatient Medications on File Prior to Visit  Medication Sig Dispense Refill   B Complex Vitamins (B COMPLEX PO) Take 1 tablet by mouth daily.     Cholecalciferol (D3 PO) Take 1 capsule by mouth daily.     MAGNESIUM PO Take by mouth daily.     Multiple Vitamin (MULTI-VITAMIN) tablet Take 1  tablet by mouth daily.     Nerve Stimulator (CEFALY KIT) DEVI 1 each by Does not apply route daily at 12 noon. 1 each 1   SUMAtriptan (IMITREX) 50 MG tablet Take 1 tablet (50 mg total) by mouth as directed. Take 1 tab at onset of headache, can reapeat once in 2 hrs if needed 10 tablet 3   No current facility-administered medications on file prior to visit.    ALLERGIES: No Known Allergies  FAMILY HISTORY: Family History  Problem Relation Age of Onset   Diabetes Mother    Hypertension Father    Hyperlipidemia Father    Cerebral aneurysm Cousin     Objective:  Blood pressure 121/85, pulse 89, height 5\' 3"  (1.6 m), weight 164 lb (74.4 kg), SpO2 98%. General: No acute distress.  Patient appears well-groomed.   Head:  Normocephalic/atraumatic Eyes:  fundi examined but not visualized Neck: supple, no paraspinal tenderness, full range of motion Back: No paraspinal tenderness Heart: regular rate and rhythm Lungs: Clear to auscultation bilaterally. Vascular: No carotid bruits. Neurological Exam: Mental status: alert and oriented to person, place, and time, speech fluent and not dysarthric, language intact. Cranial nerves: CN I: not tested CN II: pupils equal, round and reactive to light, visual fields intact CN III, IV, VI:  full range of motion, no nystagmus, no ptosis CN V: facial sensation intact. CN VII: upper and lower face symmetric CN VIII: hearing intact CN IX, X: gag intact, uvula midline CN XI: sternocleidomastoid and trapezius muscles intact CN XII: tongue midline Bulk &  Tone: normal, no fasciculations. Motor:  muscle strength 5/5 throughout Sensation:  Pinprick, temperature and vibratory sensation intact. Deep Tendon Reflexes:  2+ throughout,  toes downgoing.   Finger to nose testing:  Without dysmetria.   Heel to shin:  Without dysmetria.   Gait:  Normal station and stride.  Romberg negative.    Thank you for allowing me to take part in the care of this  patient.  Shon Millet, DO  CC: Jeani Sow, MD

## 2023-05-29 ENCOUNTER — Encounter: Payer: Self-pay | Admitting: Neurology

## 2023-05-29 ENCOUNTER — Ambulatory Visit: Payer: Federal, State, Local not specified - PPO | Admitting: Neurology

## 2023-05-29 VITALS — BP 121/85 | HR 89 | Ht 63.0 in | Wt 164.0 lb

## 2023-05-29 DIAGNOSIS — G43009 Migraine without aura, not intractable, without status migrainosus: Secondary | ICD-10-CM | POA: Diagnosis not present

## 2023-05-29 MED ORDER — RIZATRIPTAN BENZOATE 10 MG PO TABS: 10.0000 mg | ORAL_TABLET | ORAL | 5 refills | Status: DC | PRN

## 2023-05-29 MED ORDER — ONDANSETRON HCL 4 MG PO TABS: 4.0000 mg | ORAL_TABLET | Freq: Three times a day (TID) | ORAL | 5 refills | Status: DC | PRN

## 2023-05-29 NOTE — Patient Instructions (Addendum)
  Continue neck exercises Stop sumatriptan.  Instead, Take rizatriptan 10mg  at earliest onset of headache.  May repeat dose once in 2 hours if needed.  Maximum 2 tablets in 24 hours.  If that doesn't work, then next time you have a migraine, take 100mg  of sumatriptan (instead of 50mg ).  If that doesn't work, let me know and I will have you try something else.  DO NOT TAKE SUMATRIPTAN WITHIN 24 HOURS OF RIZATRIPTAN Take ondansetron for nausea with the migraines Limit use of pain relievers to no more than 2 days out of the week.  These medications include acetaminophen, NSAIDs (ibuprofen/Advil/Motrin, naproxen/Aleve, triptans (Imitrex/sumatriptan), Excedrin, and narcotics.  This will help reduce risk of rebound headaches. Be aware of common food triggers Routine exercise Stay adequately hydrated (aim for 64 oz water daily) Keep headache diary Maintain proper stress management Maintain proper sleep hygiene Do not skip meals Consider supplements:  magnesium citrate 400mg  daily, riboflavin 400mg  daily, coenzyme Q10 300mg  daily.

## 2023-06-29 ENCOUNTER — Ambulatory Visit: Payer: Federal, State, Local not specified - PPO | Admitting: Psychiatry

## 2023-08-08 DIAGNOSIS — Z23 Encounter for immunization: Secondary | ICD-10-CM | POA: Diagnosis not present

## 2023-09-15 ENCOUNTER — Ambulatory Visit: Payer: Federal, State, Local not specified - PPO | Admitting: Family Medicine

## 2023-09-29 ENCOUNTER — Ambulatory Visit: Payer: Federal, State, Local not specified - PPO | Admitting: Family Medicine

## 2023-10-20 NOTE — Progress Notes (Unsigned)
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  She defers at this time. Migraine rescue:  Rizatriptan; Zofran 4mg  for nausea.   Limit use of pain relievers to no more than 2 days out of week to prevent risk of rebound or medication-overuse headache. Keep headache diary Follow up 6 months.   Subjective:  Sharon Clark is a 17 year old right-handed female with anxiety, bicuspid aortic valve, and history of right optic neuritis/clinically isolated syndrome who follows up for migraine.  UPDATE: Since returning to school, migraines have gotten better.   Intensity:  5/10 Duration:  1-2 hours with rizatriptan Frequency:  4-8 times a month.  Current NSAIDS/analgesics:  none Current triptans:  rizatriptan 10mg  Current ergotamine:  none Current anti-emetic:  Zofran 4mg  Current muscle relaxants:  none Current Antihypertensive medications:  none Current Antidepressant medications:  none Current Anticonvulsant medications:  none Current anti-CGRP:  none Current Vitamins/Herbal/Supplements:  magnesium 250mg  daily, MVI, D3, B complex Current Antihistamines/Decongestants:  none Other therapy:  warm pack, chiropractic therapy Birth control:  none   Caffeine:  No coffee.  No soda or tea Diet:  40-60 + oz water daily.  Tries not to skip meals Exercise:  cardio/running, weights Depression:  not currently; Anxiety:  sometimes Other pain:  none Sleep hygiene:  Often fatigued.  Sleeps 10 hours a night.  HISTORY: She has past history of right optic neuritis in 2011.  Brain MRI revealed multiple lesions.  Admitted to [facility] where she underwent workup.  MRI of  cervical/thoracic/lumbar spine was negative for cord lesions or enhancing abnormalities.  CSF results were normal, including cell count, protein, glucose, ACE, ANA, negative HSV, VZV, EBV, CMP, cryptococcal Ags, Toxoplasma Abs, RPR, and negative oligoclonal bands.  Serum anti-NMO Ab was negative.  She was treated with IV SoluMedrol for 3 days followed by prednisone taper.  For 3 years, she has been monitored with follow-up MRIs every 6 months which revealed resolution of the lesions over time.  She was followed by [facility] neurology.  Unclear if she has MS vs sequela to flu mist.  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    Migraines since [age] years old.  Initially infrequent but progressively got worse during highschool.  They became almost daily.  Occur with standing and routine movement.  They improved when she went away to college, occurring once every 2 weeks.  However, frequency started to increase at end of the semester.  When she returned home for winter break, headaches improved.  Became worse mid second semester, almost daily again, 9/10 severity.  She needed to be treated in the ED.  She went back to her chiropractor and they are now less often.  They now are 5/10 pounding and pressure unilateral (either side) between and above eyes and sometimes occipital region.  Associated with nausea, photophobia, phonophobia, osmophobia, sometimes vomiting but no visual symptoms, fatigue but no numbness or weakness.  No preceding aura.  Lasts at least 6 hours to most of day (originally within one hour with sumatriptan but has lost efficacy).  Since seeing chiropractor, they occur once every 3 weeks.  Out of school now for the summer.  Triggers are unknown but maybe environmental as they  initially improved after going to school.  Stress may be a trigger.     Followed by pediatric neurology at [facility] for a few years.  At one point, she exhibited right sided weakness in  2015 and had MRI of brain performed which did not reveal any new or enhancing lesions.  Does not remember this event.  No recurrence.    Past NSAIDS/analgesics:  ibuprofen, acetaminophen Past abortive triptans:  sumatriptan tab Past abortive ergotamine:  none Past muscle relaxants:  none Past anti-emetic:  Compazine Past antihypertensive medications:  none Past antidepressant medications:  amitriptyline Past anticonvulsant medications:  none Past anti-CGRP:  none Past vitamins/Herbal/Supplements:  none Past antihistamines/decongestants:  none Other past therapies:  none   Family history::  father (migraines), paternal grandmother (migraines), maternal cousins (migraines).  Maternal cousin and one other cousin had cerebral aneurysm.    10/21/2010 MRI BRAIN:  1.  Multiple diffuse peripherally enhancing lesions throughout the brain as described.  This most likely represents a diffuse  demyelinating process or infectious/inflammatory process.  Tumefactive multiple sclerosis is the first consideration.  There is a history of recent sinus infection.  This could represent an aggressive ADEM.  If the patient is immune compromised, this could represents an infectious process, most likely viral or fungal. Tuberculosis could be considered.  The typical basilar meningitis is not present.  10/21/2010 MRI ORBITS W WO:  Findings compatible with right optic neuritis.  This favors a demyelinating process for the multiple intracranial lesions.  10/14/2010 MRI TOTAL CTL SPINE W WO:  Normal appearance of the cervical, thoracic and lumbar spine. 12/02/2010 MRI BRAIN W WO:  Marked interval improvement in the appearance of the brain with a significant interval decrease in the size of multifocal white matter lesions involving the supratentorial and infratentorial brain parenchyma, as described above. These lesion do not restrict  diffusion and no longer enhance.  Interval resolution of mass effect  on the  surrounding brain parenchyma. Differential diagnosis remains  unchanged with imaging appearance favoring demyelinating diseases,  including both ADEM and multiple sclerosis. Neoplasm is considered  less likely given the involvement of the right optic nerve which is best seen on the outside hospital dedicated orbit MRI on 10/21/2010. No definite new lesions identified. Improvement of lesions is likely related to an interval response to steroid therapy. Persistent abnormal hyperintense T2/FLAIR signal in the right optic nerve with possible subtle persistent enhancement in the orbital apex. There is some questionable signal abnormality in the  intraorbital left optic nerve however, evaluation is limited as dedicated views of the orbit were performed on this examination. Recommend attention on follow up.  12/02/2010 VISUAL EVOKED RESPONSE:  This was an abnormal VEP recording.  This recording showed significant prolongation of the P100 latency of the right eye, which is consistent with possibility of demyelinating lesion anterior to optic chiasm.  Clinical correlation is recommended.  02/23/2011 MRI BRAIN W WO:  Multiple persistent but much improved overall appearance of size in conspicuity of several white matter lesions many with a masslike configuration of the cerebral hemispheres and cerebellar hemispheres bilaterally. Persistent abnormal signal in the right optic nerve is likely present. No evidence of enhancement. The appearance continues to be consistent with suspected demyelination such as ADEM versus multiple sclerosis versus unusual manifestation of CNS infection  09/15/2011 MRI BRAIN WO:  1. Persistent, but decreased conspicuity of bilateral cerebral and cerebellar white matter lesions is suggestive of positive therapeutic response/continued improvement over serial examinations. No definite new lesions or  restricted diffusion.  As described previously, appearance remains consistent with a demyelinating  process.  2. Stable appearance of abnormal FLAIR hyperintensity involving the right optic nerve.  03/15/2012 MRI BRAIN WO:  Similar in number and distribution of T2/FLAIR hyperintense white matter lesions bilaterally, with continued decreased conspicuity and no definite new lesions or restricted diffusion. Overall appearance remains compatible with positive therapeutic  response/continued resolution.  03/14/2013 MRI BRAIN WO:  Stable appearance of bilateral white matter lesions (likely demyelinating disaease) compared to 03/15/2012.  02/11/2014 MRI BRAIN W WO:  Apparently increased T2/FLAIR signal intensity within the previously identified white matter lesions is secondary to differences in field strength. No new, enhancing, or diffusion-restricting lesions are identified. No acute intracranial abnormality.   03/20/2013 CTA HEAD & NECK:  1. Normal CTA of the head and neck. No large vessel occlusion,hemodynamically significant stenosis, or other acute vascularabnormality. No aneurysm.2. No other acute intracranial abnormality.  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     Cholecalciferol (D3 PO) Take 1 capsule by mouth daily.     MAGNESIUM PO Take by mouth daily.     Nerve Stimulator (CEFALY KIT) DEVI 1 each by Does not apply route daily at 12 noon. 1 each 1   ondansetron (ZOFRAN) 4 MG tablet Take 1 tablet (4 mg total) by mouth every 8 (eight) hours as needed. 20 tablet 5   rizatriptan (MAXALT) 10 MG tablet Take 1 tablet (10 mg total) by mouth as needed for migraine. May repeat in 2 hours if needed.  Maximum 2 tablets  in 24 hours. 10 tablet 5   SUMAtriptan (IMITREX) 50 MG tablet Take 1 tablet (50 mg total) by mouth as directed. Take 1 tab at onset of headache, can reapeat once in 2 hrs if needed 10 tablet 3   No current facility-administered medications on file prior to visit.    ALLERGIES: No Known Allergies  FAMILY HISTORY: Family History  Problem Relation Age of Onset   Diabetes Mother    Migraines Father    Hypertension Father    Hyperlipidemia Father    Migraines Paternal Aunt    Migraines Paternal Uncle    Stroke Maternal Grandfather    Migraines Paternal Grandmother    Stroke Paternal Grandfather    Cerebral aneurysm Cousin       Objective:  Blood pressure 110/75, pulse 68, height 5\' 3"  (1.6 m), weight 157 lb (71.2 kg), SpO2 98%. General: No acute distress.  Patient appears well-groomed.     Shon Millet, DO  CC: Jeani Sow, MD

## 2023-10-21 ENCOUNTER — Encounter: Payer: Self-pay | Admitting: Neurology

## 2023-10-21 ENCOUNTER — Ambulatory Visit: Payer: Federal, State, Local not specified - PPO | Admitting: Neurology

## 2023-10-21 VITALS — BP 110/75 | HR 68 | Ht 63.0 in | Wt 157.0 lb

## 2023-10-21 DIAGNOSIS — Z8669 Personal history of other diseases of the nervous system and sense organs: Secondary | ICD-10-CM | POA: Diagnosis not present

## 2023-10-21 DIAGNOSIS — G43009 Migraine without aura, not intractable, without status migrainosus: Secondary | ICD-10-CM | POA: Diagnosis not present

## 2023-10-21 MED ORDER — RIZATRIPTAN BENZOATE 10 MG PO TABS: 10.0000 mg | ORAL_TABLET | ORAL | 5 refills | Status: DC | PRN

## 2024-04-19 NOTE — Progress Notes (Signed)
 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   Limit use of pain relievers to no more than 9 days out of the month to prevent risk of rebound or medication-overuse headache. Keep headache diary Follow up 1 year   Subjective:  Sharon Clark is a 18 year old right-handed female with anxiety, bicuspid aortic valve, and history of right optic neuritis/clinically isolated syndrome who follows up for migraine.  UPDATE: Doing well.   Intensity:  5/10 Duration:  1-2 hours with rizatriptan  Frequency:  5 to 6 a month.   Current NSAIDS/analgesics:  none Current triptans:  rizatriptan  10mg  Current ergotamine:  none Current anti-emetic:  Zofran  4mg  Current muscle relaxants:  none Current Antihypertensive medications:  none Current Antidepressant medications:  none Current Anticonvulsant medications:  none Current anti-CGRP:  none Current Vitamins/Herbal/Supplements:  magnesium 250mg  daily, MVI, D3, B complex Current Antihistamines/Decongestants:  none Other therapy:  warm pack, chiropractic therapy Birth control:  none   Caffeine:  No coffee.  No soda or tea Diet:  40-60 + oz water daily.  Tries not to skip meals Exercise:  cardio/running, weights Depression:  not currently; Anxiety:  sometimes Other pain:  none Sleep hygiene:  Often fatigued.  Sleeps 10 hours a night.  HISTORY: She has past history of right optic neuritis in 2011.  Brain MRI revealed multiple lesions.  Admitted to [facility] where she underwent workup.  MRI of cervical/thoracic/lumbar spine was negative for cord lesions or enhancing abnormalities.  CSF results were normal, including cell  count, protein, glucose, ACE, ANA, negative HSV, VZV, EBV, CMP, cryptococcal Ags, Toxoplasma Abs, RPR, and negative oligoclonal bands.  Serum anti-NMO Ab was negative.  She was treated with IV SoluMedrol for 3 days followed by prednisone taper.  For 3 years, she has been monitored with follow-up MRIs every 6 months which revealed resolution of the lesions over time.  She was followed by [facility] neurology.  Unclear if she has MS vs sequela to flu mist.  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    Migraines since [age] years old.  Initially infrequent but progressively got worse during highschool.  They became almost daily.  Occur with standing and routine movement.  They improved when she went away to college, occurring once every 2 weeks.  However, frequency started to increase at end of the semester.  When she returned home for winter break, headaches improved.  Became worse mid second semester, almost daily again, 9/10 severity.  She needed to be treated in the ED.  She went back to her chiropractor and they are now less often.  They now are 5/10 pounding and pressure unilateral (either side) between and above eyes and sometimes occipital region.  Associated with nausea, photophobia, phonophobia, osmophobia, sometimes vomiting but no visual symptoms, fatigue but no numbness or weakness.  No preceding aura.  Lasts at least 6 hours to most of day (originally within one hour with sumatriptan  but has lost efficacy).  Since seeing chiropractor, they occur once every 3 weeks.  Out of school now for the summer.  Triggers are unknown but maybe environmental as they initially improved after going to school.  Stress may be a trigger.  Followed by pediatric neurology at [facility] for a few years.  At one point, she exhibited right sided weakness in 2015 and had MRI of brain performed which did not reveal any new or enhancing lesions.  Does not remember this event.  No recurrence.     Past NSAIDS/analgesics:  ibuprofen, acetaminophen Past abortive triptans:  sumatriptan  tab Past abortive ergotamine:  none Past muscle relaxants:  none Past anti-emetic:  Compazine Past antihypertensive medications:  none Past antidepressant medications:  amitriptyline Past anticonvulsant medications:  none Past anti-CGRP:  none Past vitamins/Herbal/Supplements:  none Past antihistamines/decongestants:  none Other past therapies:  none   Family history::  father (migraines), paternal grandmother (migraines), maternal cousins (migraines).  Maternal cousin and one other cousin had cerebral aneurysm.    10/21/2010 MRI BRAIN:  1.  Multiple diffuse peripherally enhancing lesions throughout the brain as described.  This most likely represents a diffuse  demyelinating process or infectious/inflammatory process.  Tumefactive multiple sclerosis is the first consideration.  There is a history of recent sinus infection.  This could represent an aggressive ADEM.  If the patient is immune compromised, this could represents an infectious process, most likely viral or fungal. Tuberculosis could be considered.  The typical basilar meningitis is not present.  10/21/2010 MRI ORBITS W WO:  Findings compatible with right optic neuritis.  This favors a demyelinating process for the multiple intracranial lesions.  10/14/2010 MRI TOTAL CTL SPINE W WO:  Normal appearance of the cervical, thoracic and lumbar spine. 12/02/2010 MRI BRAIN W WO:  Marked interval improvement in the appearance of the brain with a significant interval decrease in the size of multifocal white matter lesions involving the supratentorial and infratentorial brain parenchyma, as described above. These lesion do not restrict  diffusion and no longer enhance.  Interval resolution of mass effect  on the surrounding brain parenchyma. Differential diagnosis remains  unchanged with imaging appearance favoring demyelinating diseases,  including  both ADEM and multiple sclerosis. Neoplasm is considered  less likely given the involvement of the right optic nerve which is best seen on the outside hospital dedicated orbit MRI on 10/21/2010. No definite new lesions identified. Improvement of lesions is likely related to an interval response to steroid therapy. Persistent abnormal hyperintense T2/FLAIR signal in the right optic nerve with possible subtle persistent enhancement in the orbital apex. There is some questionable signal abnormality in the  intraorbital left optic nerve however, evaluation is limited as dedicated views of the orbit were performed on this examination. Recommend attention on follow up.  12/02/2010 VISUAL EVOKED RESPONSE:  This was an abnormal VEP recording.  This recording showed significant prolongation of the P100 latency of the right eye, which is consistent with possibility of demyelinating lesion anterior to optic chiasm.  Clinical correlation is recommended.  02/23/2011 MRI BRAIN W WO:  Multiple persistent but much improved overall appearance of size in conspicuity of several white matter lesions many with a masslike configuration of the cerebral hemispheres and cerebellar hemispheres bilaterally. Persistent abnormal signal in the right optic nerve is likely present. No evidence of enhancement. The appearance continues to be consistent with suspected demyelination such as ADEM versus multiple sclerosis versus unusual manifestation of CNS infection  09/15/2011 MRI BRAIN WO:  1. Persistent, but decreased conspicuity of bilateral cerebral and cerebellar white matter lesions is suggestive of positive therapeutic response/continued improvement over serial examinations. No definite new lesions or restricted diffusion.  As described previously, appearance remains consistent with a demyelinating process.  2. Stable  appearance of abnormal FLAIR hyperintensity involving the right optic nerve.  03/15/2012 MRI BRAIN WO:  Similar in number  and distribution of T2/FLAIR hyperintense white matter lesions bilaterally, with continued decreased conspicuity and no definite new lesions or restricted diffusion. Overall appearance remains compatible with positive therapeutic  response/continued resolution.  03/14/2013 MRI BRAIN WO:  Stable appearance of bilateral white matter lesions (likely demyelinating disaease) compared to 03/15/2012.  02/11/2014 MRI BRAIN W WO:  Apparently increased T2/FLAIR signal intensity within the previously identified white matter lesions is secondary to differences in field strength. No new, enhancing, or diffusion-restricting lesions are identified. No acute intracranial abnormality.   03/20/2013 CTA HEAD & NECK:  1. Normal CTA of the head and neck. No large vessel occlusion,hemodynamically significant stenosis, or other acute vascularabnormality. No aneurysm.2. No other acute intracranial abnormality.  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     Cholecalciferol (D3 PO) Take 1 capsule by mouth daily.     MAGNESIUM PO Take by mouth daily. (Patient not taking: Reported on 04/20/2024)     No current facility-administered medications on file prior to visit.     ALLERGIES: No Known Allergies  FAMILY HISTORY: Family History  Problem Relation Age of Onset   Diabetes Mother    Migraines Father    Hypertension Father    Hyperlipidemia Father    Migraines Paternal Aunt    Migraines Paternal Uncle    Stroke Maternal Grandfather    Migraines Paternal Grandmother    Stroke Paternal Grandfather    Cerebral aneurysm Cousin        Objective:  Blood pressure 99/75, pulse 89, height 5' 3 (1.6 m), weight 156 lb (70.8 kg), SpO2 97%. General: No acute distress.  Patient appears well-groomed.   Head:  Normocephalic/atraumatic Neck:  Supple.  No paraspinal tenderness.  Full range of motion. Heart:  Regular rate and rhythm. Neuro:  Alert and oriented.  Speech fluent and not dysarthric.  Language intact.  CN II-XII intact.  Bulk and tone normal.  Muscle strength 5/5 throughout.  Sensation to light touch intact.  Deep tendon reflexes 2+ throughout, toes downgoing.  Gait normal.  Romberg negative.   Sharon Members, DO  CC: Christel Cousins, MD

## 2024-04-20 ENCOUNTER — Ambulatory Visit: Payer: Federal, State, Local not specified - PPO | Admitting: Neurology

## 2024-04-20 ENCOUNTER — Encounter: Payer: Self-pay | Admitting: Neurology

## 2024-04-20 VITALS — BP 99/75 | HR 89 | Ht 63.0 in | Wt 156.0 lb

## 2024-04-20 DIAGNOSIS — G43009 Migraine without aura, not intractable, without status migrainosus: Secondary | ICD-10-CM | POA: Diagnosis not present

## 2024-04-20 DIAGNOSIS — Z8669 Personal history of other diseases of the nervous system and sense organs: Secondary | ICD-10-CM | POA: Diagnosis not present

## 2024-04-20 MED ORDER — ONDANSETRON HCL 4 MG PO TABS: 4.0000 mg | ORAL_TABLET | Freq: Three times a day (TID) | ORAL | 11 refills | Status: AC | PRN

## 2024-04-20 MED ORDER — RIZATRIPTAN BENZOATE 10 MG PO TABS: 10.0000 mg | ORAL_TABLET | ORAL | 11 refills | Status: AC | PRN

## 2024-05-05 ENCOUNTER — Telehealth: Payer: Self-pay | Admitting: *Deleted

## 2024-05-05 NOTE — Telephone Encounter (Signed)
 Copied from CRM [id number]. 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 Results are needed before Aug. Please also let the pt know how much it will be out of pocket or with insurance if covered.

## 2024-05-19 ENCOUNTER — Ambulatory Visit: Admitting: Family Medicine

## 2024-05-19 ENCOUNTER — Ambulatory Visit: Payer: Self-pay | Admitting: Family Medicine

## 2024-05-19 ENCOUNTER — Encounter: Payer: Self-pay | Admitting: Family Medicine

## 2024-05-19 VITALS — BP 112/72 | HR 88 | Temp 98.1°F | Ht 63.0 in | Wt 159.6 lb

## 2024-05-19 DIAGNOSIS — E785 Hyperlipidemia, unspecified: Secondary | ICD-10-CM | POA: Insufficient documentation

## 2024-05-19 DIAGNOSIS — L309 Dermatitis, unspecified: Secondary | ICD-10-CM | POA: Insufficient documentation

## 2024-05-19 DIAGNOSIS — Z0000 Encounter for general adult medical examination without abnormal findings: Secondary | ICD-10-CM | POA: Diagnosis not present

## 2024-05-19 DIAGNOSIS — N938 Other specified abnormal uterine and vaginal bleeding: Secondary | ICD-10-CM | POA: Insufficient documentation

## 2024-05-19 DIAGNOSIS — Z111 Encounter for screening for respiratory tuberculosis: Secondary | ICD-10-CM | POA: Diagnosis not present

## 2024-05-19 DIAGNOSIS — H469 Unspecified optic neuritis: Secondary | ICD-10-CM | POA: Insufficient documentation

## 2024-05-19 DIAGNOSIS — K219 Gastro-esophageal reflux disease without esophagitis: Secondary | ICD-10-CM | POA: Insufficient documentation

## 2024-05-19 DIAGNOSIS — E559 Vitamin D deficiency, unspecified: Secondary | ICD-10-CM | POA: Insufficient documentation

## 2024-05-19 DIAGNOSIS — R519 Headache, unspecified: Secondary | ICD-10-CM | POA: Insufficient documentation

## 2024-05-19 LAB — CBC WITH DIFFERENTIAL/PLATELET
Basophils Absolute: 0 K/uL (ref 0.0–0.1)
Basophils Relative: 0.3 % (ref 0.0–3.0)
Eosinophils Absolute: 0.1 K/uL (ref 0.0–0.7)
Eosinophils Relative: 1.5 % (ref 0.0–5.0)
HCT: 40.8 % (ref 36.0–46.0)
Hemoglobin: 13.4 g/dL (ref 12.0–15.0)
Lymphocytes Relative: 18.7 % (ref 12.0–46.0)
Lymphs Abs: 1.6 K/uL (ref 0.7–4.0)
MCHC: 32.8 g/dL (ref 30.0–36.0)
MCV: 85.2 fl (ref 78.0–100.0)
Monocytes Absolute: 0.5 K/uL (ref 0.1–1.0)
Monocytes Relative: 6.2 % (ref 3.0–12.0)
Neutro Abs: 6.2 K/uL (ref 1.4–7.7)
Neutrophils Relative %: 73.3 % (ref 43.0–77.0)
Platelets: 292 K/uL (ref 150.0–400.0)
RBC: 4.79 Mil/uL (ref 3.87–5.11)
RDW: 13.5 % (ref 11.5–14.6)
WBC: 8.5 K/uL (ref 4.5–10.5)

## 2024-05-19 LAB — COMPREHENSIVE METABOLIC PANEL WITH GFR
ALT: 17 U/L (ref 0–35)
AST: 19 U/L (ref 0–37)
Albumin: 4.3 g/dL (ref 3.5–5.2)
Alkaline Phosphatase: 70 U/L (ref 39–117)
BUN: 11 mg/dL (ref 6–23)
CO2: 23 meq/L (ref 19–32)
Calcium: 9.5 mg/dL (ref 8.4–10.5)
Chloride: 104 meq/L (ref 96–112)
Creatinine, Ser: 0.61 mg/dL (ref 0.40–1.20)
GFR: 128.94 mL/min (ref >60.00)
Glucose, Bld: 81 mg/dL (ref 70–99)
Potassium: 4.4 meq/L (ref 3.5–5.1)
Sodium: 138 meq/L (ref 135–145)
Total Bilirubin: 0.4 mg/dL (ref 0.2–1.2)
Total Protein: 6.8 g/dL (ref 6.0–8.3)

## 2024-05-19 LAB — LIPID PANEL
Cholesterol: 207 mg/dL — ABNORMAL HIGH (ref 0–200)
HDL: 46.5 mg/dL (ref >39.00)
LDL Cholesterol: 136 mg/dL — ABNORMAL HIGH (ref 0–99)
NonHDL: 160.76
Total CHOL/HDL Ratio: 4
Triglycerides: 126 mg/dL (ref 0.0–149.0)
VLDL: 25.2 mg/dL (ref 0.0–40.0)

## 2024-05-19 LAB — HEMOGLOBIN A1C: Hgb A1c MFr Bld: 5.7 % (ref 4.6–6.5)

## 2024-05-19 LAB — TSH: TSH: 1.38 u[IU]/mL (ref 0.35–5.50)

## 2024-05-19 NOTE — Progress Notes (Signed)
 Phone [phone]   Subjective:   Patient is a 18 y.o. female presenting for annual physical.    Chief Complaint  Patient presents with   Annual Exam    Pt in today for physical/tb blood test; no major changes in health;    Annual-needs tb test to volunteer in hosp. Going to school for public health. Discussed the use of AI scribe software for clinical note transcription with the patient, who gave verbal consent to proceed.  History of Present Illness Sharon Clark is a 18 year old female who presents for an annual physical exam and TB testing.  She is currently studying public health at [employer] and plans to volunteer at a hospital in the fall, which requires TB testing. She maintains an active lifestyle, exercises regularly, and follows a healthy diet. No current health concerns or problems. She does not smoke, drink, or use drugs.  She continues to experience migraines and takes medication as needed. She is under the care of a neurologist for this condition.    See problem oriented charting- ROS- ROS: Gen: no fever, chills  Skin: no rash, itching ENT: no ear pain, ear drainage, nasal congestion, rhinorrhea, sinus pressure, sore throat Eyes: no blurry vision, double vision Resp: no cough, wheeze,SOB CV: no CP, palpitations, LE edema,  GI: no heartburn, n/v/d/c, abd pain GU: no dysuria, urgency, frequency, hematuria.  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)    past   Patient Active Problem List   Diagnosis Date Noted   Eczema 05/19/2024   Dysfunctional uterine bleeding 05/19/2024    Gastroesophageal reflux disease 05/19/2024   Headache 05/19/2024   Hyperlipidemia 05/19/2024   Optic neuritis 05/19/2024   Vitamin D deficiency 05/19/2024   Bicuspid aortic valve 08/12/2019   Migraine without aura and without status migrainosus, not intractable 08/11/2018   Episodic tension-type headache, not intractable 08/11/2018   Abdominal pain, chronic, generalized 04/03/2014   Epigastric pain 02/28/2014   Nausea & vomiting 02/28/2014   Demyelinating disease of central nervous system (HCC) 03/15/2012   History reviewed. No pertinent surgical history.  Family History  Problem Relation Age of Onset   Diabetes Mother    Migraines Father    Hypertension Father    Hyperlipidemia Father    Migraines Paternal Aunt    Migraines Paternal Uncle    Stroke Maternal Grandfather    Migraines Paternal Grandmother    Stroke Paternal Grandfather    Cerebral aneurysm Cousin     Medications- reviewed and updated Current Outpatient Medications  Medication Sig Dispense Refill   B Complex Vitamins (B COMPLEX PO) Take 1 tablet by mouth daily.     Cholecalciferol (D3 PO) Take 1 capsule by mouth daily.     MAGNESIUM PO Take by mouth daily.     ondansetron  (ZOFRAN ) 4 MG tablet Take 1 tablet (4 mg total) by mouth every 8 (eight) hours as needed. 20 tablet 11   rizatriptan  (MAXALT ) 10 MG tablet Take 1 tablet (10 mg total) by mouth as needed for migraine. May repeat in 2 hours  if needed.  Maximum 2 tablets in 24 hours. 10 tablet 11   No current facility-administered medications for this visit.    Allergies-reviewed and updated No Known Allergies  Social History   Social History Narrative   She lives with both parents. She has one sister.   Lives in [place] in [place]   Objective  Objective:  BP 112/72 (BP Location: Left Arm, Patient Position: Sitting, Cuff Size: Normal)   Pulse 88   Temp 98.1 F (36.7 C) (Temporal)   Ht 5' 3 (1.6 m)   Wt 159 lb 9.6 oz (72.4 kg)    SpO2 98%   BMI 28.27 kg/m  Physical Exam  Gen: WDWN NAD HEENT: NCAT, conjunctiva not injected, sclera nonicteric TM WNL B, OP moist, no exudates  NECK:  supple, no thyromegaly, no nodes, no carotid bruits CARDIAC: RRR, S1S2+, no murmur. DP 2+B LUNGS: CTAB. No wheezes ABDOMEN:  BS+, soft, NTND, No HSM, no masses EXT:  no edema MSK: no gross abnormalities. MS 5/5 all 4 NEURO: A&O x3.  CN II-XII intact.  PSYCH: normal mood. Good eye contact      Assessment and Plan   Health Maintenance counseling: 1. Anticipatory guidance: Patient counseled regarding regular dental exams q6 months, eye exams,  avoiding smoking and second hand smoke, limiting alcohol to 1 beverage per day, no illicit drugs.   2. Risk factor reduction:  Advised patient of need for regular exercise and diet rich and fruits and vegetables to reduce risk of heart attack and stroke. Exercise- +.  Wt Readings from Last 3 Encounters:  05/19/24 159 lb 9.6 oz (72.4 kg)  04/20/24 156 lb (70.8 kg)  10/21/23 157 lb (71.2 kg) (86%, Z= 1.07)*   * Growth percentiles are based on CDC (Girls, 2-20 Years) data.   3. Immunizations/screenings/ancillary studies Immunization History  Administered Date(s) Administered   DTaP 06/05/2004, 07/16/2004, 09/12/2004, 10/03/2005, 06/08/2008   Fluzone Influenza virus vaccine,trivalent (IIV3), split virus 08/25/2011, 09/10/2012, 11/13/2017   HIB (PRP-T) 06/05/2004, 07/16/2004, 05/05/2005, 10/03/2005   HPV 9-valent 02/07/2016, 02/12/2017   HPV Quadrivalent 02/12/2017   Hepatitis A, Ped/Adol-2 Dose 07/25/2005, 12/09/2005, 04/07/2022   Hepatitis B, PED/ADOLESCENT 04/05/2004, 06/05/2004, 07/16/2004, 09/12/2004   IPV 06/05/2004, 07/16/2004, 09/12/2004, 10/03/2005, 06/08/2008   Influenza Inj Mdck Quad Pf 11/13/2017   Influenza Nasal 08/23/2010   Influenza,inj,quad, With Preservative 08/17/2013, 08/25/2014, 11/14/2016   Influenza-Unspecified 11/07/2022   MMR 06/20/2005, 01/23/2006   Meningococcal  B, OMV 05/08/2021, 07/01/2021   Meningococcal Conjugate 02/07/2016, 05/08/2021   PFIZER(Purple Top)SARS-COV-2 Vaccination 01/27/2020, 02/17/2020, 10/13/2020, 11/07/2022   Pfizer Covid-19 Vaccine Bivalent Booster 58yrs & up 11/04/2021   Pneumococcal Conjugate PCV 7 06/05/2004, 07/16/2004, 09/12/2004   Pneumococcal Conjugate-13 08/25/2014   Tdap 02/07/2016   Varicella 04/04/2005, 06/08/2008   There are no preventive care reminders to display for this patient.   4. Cervical cancer screening: n/a 5. Skin cancer screening- advised regular sunscreen use. Denies worrisome, changing, or new skin lesions.  6. Birth control/STD check: condoms 7. Smoking associated screening: non smoker 8. Alcohol screening: non  Wellness examination -     Lipid panel -     Comprehensive metabolic panel with GFR -     CBC with Differential/Platelet -     Hemoglobin A1c -     TSH -     QuantiFERON-TB Gold Plus  Screening-pulmonary TB -     QuantiFERON-TB Gold Plus  Wellness-anticipatory guidance.  Work on Diet/Exercise  Check CBC,CMP,lipids,TSH, A1C.  F/u 1 yr .  Recommended follow up: Return in about 1 year (around 05/19/2025) for annual physical.  Lab/Order associations:banana fasting   Jenkins CHRISTELLA Carrel, MD

## 2024-05-19 NOTE — Progress Notes (Signed)
 Labs ok except Your cholesterol levels are elevated.  Work on low cholesterol and lower carbs/sugars diet and  get exercise to try to lower your cholesterol.   Tb pending

## 2024-05-19 NOTE — Patient Instructions (Signed)

## 2024-05-22 LAB — QUANTIFERON-TB GOLD PLUS
Mitogen-NIL: 8.57 [IU]/mL
NIL: 0.05 [IU]/mL
QuantiFERON-TB Gold Plus: NEGATIVE
TB1-NIL: <0 [IU]/mL
TB2-NIL: 0 [IU]/mL

## 2024-05-27 NOTE — Progress Notes (Signed)
 pt TB gold is normal

## 2024-05-30 ENCOUNTER — Telehealth: Payer: Self-pay | Admitting: *Deleted

## 2024-05-30 NOTE — Telephone Encounter (Signed)
 Copied from CRM [id number]. 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

## 2024-08-16 DIAGNOSIS — Z23 Encounter for immunization: Secondary | ICD-10-CM | POA: Diagnosis not present

## 2025-04-20 ENCOUNTER — Ambulatory Visit: Admitting: Neurology

## 2025-05-23 ENCOUNTER — Encounter: Admitting: Family Medicine
